# Patient Record
Sex: Female | Born: 1990 | Hispanic: Yes | Marital: Married | Smoking: Never smoker
Health system: Southern US, Community
[De-identification: ages and names within clinical notes are randomized; demographics above are authoritative.]

## PROBLEM LIST (undated history)

## (undated) DIAGNOSIS — Z789 Other specified health status: Secondary | ICD-10-CM

## (undated) DIAGNOSIS — N83209 Unspecified ovarian cyst, unspecified side: Secondary | ICD-10-CM

## (undated) HISTORY — PX: EYE SURGERY: SHX253

---

## 2017-07-30 ENCOUNTER — Other Ambulatory Visit: Payer: Self-pay | Admitting: Advanced Practice Midwife

## 2017-07-30 DIAGNOSIS — Z369 Encounter for antenatal screening, unspecified: Secondary | ICD-10-CM

## 2017-07-30 DIAGNOSIS — Z3689 Encounter for other specified antenatal screening: Secondary | ICD-10-CM

## 2017-08-16 ENCOUNTER — Other Ambulatory Visit: Payer: Self-pay

## 2017-08-20 ENCOUNTER — Ambulatory Visit
Admission: RE | Admit: 2017-08-20 | Discharge: 2017-08-20 | Disposition: A | Discharge status: Home / Self Care | Payer: Medicaid Other | Source: Ambulatory Visit | Attending: Obstetrics & Gynecology | Admitting: Obstetrics & Gynecology

## 2017-08-20 ENCOUNTER — Ambulatory Visit (HOSPITAL_COMMUNITY)
Admission: RE | Admit: 2017-08-20 | Discharge: 2017-08-20 | Disposition: A | Discharge status: Home / Self Care | Payer: Medicaid Other | Source: Ambulatory Visit

## 2017-08-20 ENCOUNTER — Encounter: Payer: Self-pay | Admitting: *Deleted

## 2017-08-20 DIAGNOSIS — Z3689 Encounter for other specified antenatal screening: Secondary | ICD-10-CM

## 2017-08-20 DIAGNOSIS — Z369 Encounter for antenatal screening, unspecified: Secondary | ICD-10-CM | POA: Diagnosis present

## 2017-08-20 HISTORY — DX: Other specified health status: Z78.9

## 2017-08-20 HISTORY — DX: Unspecified ovarian cyst, unspecified side: N83.209

## 2017-08-20 NOTE — Progress Notes (Addendum)
Referring physician:  [facility] Department Length of Consultation: 45 minutes   Ms. Kelsey Bird  was referred to [facility] [place] for genetic counseling to review prenatal screening and testing options.  This note summarizes the information we discussed with the aid of a Spanish interpreter.    We offered the following routine screening tests for this pregnancy:  First trimester screening, which includes nuchal translucency ultrasound screen and first trimester maternal serum marker screening.  The nuchal translucency has approximately an 80% detection rate for Down syndrome and can be positive for other chromosome abnormalities as well as congenital heart defects.  When combined with a maternal serum marker screening, the detection rate is up to 90% for Down syndrome and up to 97% for trisomy 18.     Maternal serum marker screening, a blood test that measures pregnancy proteins, can provide risk assessments for Down syndrome, trisomy 18, and open neural tube defects (spina bifida, anencephaly). Because it does not directly examine the fetus, it cannot positively diagnose or rule out these problems.  Targeted ultrasound uses high frequency sound waves to create an image of the developing fetus.  An ultrasound is often recommended as a routine means of evaluating the pregnancy.  It is also used to screen for fetal anatomy problems (for example, a heart defect) that might be suggestive of a chromosomal or other abnormality.   Should these screening tests indicate an increased concern, then the following additional testing options would be offered:  The chorionic villus sampling procedure is available for first trimester chromosome analysis.  This involves the withdrawal of a small amount of chorionic villi (tissue from the developing placenta).  Risk of pregnancy loss is estimated to be approximately 1 in 200 to 1 in 100 (0.5 to 1%).  There is approximately a 1% (1  in 100) chance that the CVS chromosome results will be unclear.  Chorionic villi cannot be tested for neural tube defects.     Amniocentesis involves the removal of a small amount of amniotic fluid from the sac surrounding the fetus with the use of a thin needle inserted through the maternal abdomen and uterus.  Ultrasound guidance is used throughout the procedure.  Fetal cells from amniotic fluid are directly evaluated and > 99.5% of chromosome problems and > 98% of open neural tube defects can be detected. This procedure is generally performed after the 15th week of pregnancy.  The main risks to this procedure include complications leading to miscarriage in less than 1 in 200 cases (0.5%).  As another option for information if the pregnancy is suspected to be an an increased chance for certain chromosome conditions, we also reviewed the availability of cell free fetal DNA testing from maternal blood to determine whether or not the baby may have either Down syndrome, trisomy 43, or trisomy 70.  This test utilizes a maternal blood sample and DNA sequencing technology to isolate circulating cell free fetal DNA from maternal plasma.  The fetal DNA can then be analyzed for DNA sequences that are derived from the three most common chromosomes involved in aneuploidy, chromosomes 13, 18, and 21.  If the overall amount of DNA is greater than the expected level for any of these chromosomes, aneuploidy is suspected.  While we do not consider it a replacement for invasive testing and karyotype analysis, a negative result from this testing would be reassuring, though not a guarantee of a normal chromosome complement for the baby.  An abnormal result  is certainly suggestive of an abnormal chromosome complement, though we would still recommend CVS or amniocentesis to confirm any findings from this testing.  Cystic Fibrosis and Spinal Muscular Atrophy (SMA) screening were also discussed with the patient. Both conditions are  recessive, which means that both parents must be carriers in order to have a child with the disease.  Cystic fibrosis (CF) is one of the most common genetic conditions in persons of Caucasian ancestry.  This condition occurs in approximately 1 in 2,500 Caucasian persons and results in thickened secretions in the lungs, digestive, and reproductive systems.  For a baby to be at risk for having CF, both of the parents must be carriers for this condition.  Approximately 1 in 61 Caucasian persons is a carrier for CF.  Current carrier testing looks for the most common mutations in the gene for CF and can detect approximately 90% of carriers in the Caucasian population.  This means that the carrier screening can greatly reduce, but cannot eliminate, the chance for an individual to have a child with CF.  If an individual is found to be a carrier for CF, then carrier testing would be available for the partner. As part of [place]'s newborn screening profile, all babies born in the state of [place] will have a two-tier screening process.  Specimens are first tested to determine the concentration of immunoreactive trypsinogen (IRT).  The top 5% of specimens with the highest IRT values then undergo DNA testing using a panel of over 40 common CF mutations. SMA is a neurodegenerative disorder that leads to atrophy of skeletal muscle and overall weakness.  This condition is also more prevalent in the Caucasian population, with 1 in 40-1 in 60 persons being a carrier and 1 in 6,000-1 in 10,000 children being affected.  There are multiple forms of the disease, with some causing death in infancy to other forms with survival into adulthood.  The genetics of SMA is complex, but carrier screening can detect up to 95% of carriers in the Caucasian population.  Similar to CF, a negative result can greatly reduce, but cannot eliminate, the chance to have a child with SMA.  We obtained a detailed family history and pregnancy  history.  The father of the baby, Daphine Deutscher, reported that his maternal grandmother lost several babies (perhaps 2) because they couldn't get needed medicine.  He was not sure if these were babies born too early or infants with illnesses.  He was not aware of any birth differences in these children or any reason for their death other than not being able to get medications.  This grandmother had 5 children to survive to adulthood with no health concerns.  Without additional medical information, it is difficult to determine the risks to other family members related to this history.  If more is learned, we are happy to discuss it further. Ms. Kelsey Bird reported several aunts and uncles on her mother's side who passed away from cancer.  These relatives were in their 60s and had various types of cancer.  Of note, her mother is one of 23 siblings.  We reviewed that cancer most often occurs by chance or due to environmental factors. However, we get concerned about possible inherited factors if several relatives have the same type of cancer, particularly at young ages.  If anyone in the family is concerned about this history in the future, we are happy to provide contact information for a cancer genetic counselor.  Lastly, the patient reported  two maternal great uncles with congenital hearing loss.  They were otherwise said to be in good health with normal development.  There may be many causes for hearing loss, including some which are inherited and others that are not.  Again, we would need additional medical information to determine the recurrence chance for other family members to have a similar condition. The remainder of the family history was reported to be unremarkable for birth defects, intellectual delays, recurrent pregnancy loss or known chromosome abnormalities.  Ms. Kelsey Bird stated that this is her first pregnancy.  She reported no complications or exposures that would be expected to increase the risk for birth  defects.  After consideration of the options, Ms. Kelsey Bird elected to proceed with an ultrasound only. She declined first trimester screening as well as CF and SMA carrier testing.  An appointment was scheduled for a second trimester anatomy ultrasound on 10/01/2017, as her [employer] ends on 10/03/2017.  An ultrasound was performed at the time of the visit.  The gestational age was consistent with 11 weeks.  Fetal anatomy could not be assessed due to early gestational age.  Please refer to the ultrasound report for details of that study.  Ms. Kelsey Bird was encouraged to call with questions or concerns.  We can be contacted at [phone].    Cherly Anderson, MS, CGC  Adalynne Steffensmeier, [place] A, MD

## 2017-09-27 ENCOUNTER — Other Ambulatory Visit: Payer: Self-pay | Admitting: *Deleted

## 2017-09-27 DIAGNOSIS — O99332 Smoking (tobacco) complicating pregnancy, second trimester: Secondary | ICD-10-CM

## 2017-10-01 ENCOUNTER — Ambulatory Visit
Admission: RE | Admit: 2017-10-01 | Discharge: 2017-10-01 | Disposition: A | Discharge status: Home / Self Care | Payer: Self-pay | Source: Ambulatory Visit | Attending: Obstetrics and Gynecology | Admitting: Obstetrics and Gynecology

## 2017-10-01 DIAGNOSIS — O99332 Smoking (tobacco) complicating pregnancy, second trimester: Secondary | ICD-10-CM | POA: Insufficient documentation

## 2017-10-01 DIAGNOSIS — Z3A17 17 weeks gestation of pregnancy: Secondary | ICD-10-CM | POA: Insufficient documentation

## 2017-10-08 ENCOUNTER — Other Ambulatory Visit: Payer: Self-pay

## 2018-06-03 ENCOUNTER — Encounter (HOSPITAL_COMMUNITY): Payer: Self-pay

## 2019-05-13 IMAGING — US US MFM OB COMPLETE +14 WKS
1 series · 14 of 28 positions shown · non-contrast
Comparison: none

PATIENT INFO:

KEE
PERFORMED BY:
SERVICE(S) PROVIDED:
INDICATIONS:
11 weeks gestation of pregnancy
Dating and viability
FETAL EVALUATION:
Num Of Fetuses:     1
Gest. Sac:          Normal
Fetal Pole:         Visualized
Fetal Heart         160
Rate(bpm):
Cardiac Activity:   Present
Presentation:       N/A
Placenta:           N/A
Amniotic Fluid
AFI FV:      Within normal limits
BIOMETRY:
CRL:      40.3  mm     G. Age:  11w 0d                  EDD:   03/11/18
GESTATIONAL AGE:
Best:          11w 0d     Det. By:  U/S C R L (08/20/17)     EDD:   03/11/18

[Series 1: us mfm ob complete +14 wks · 0.25mm/px · 14 of 33 slices shown]
[im 2/33]
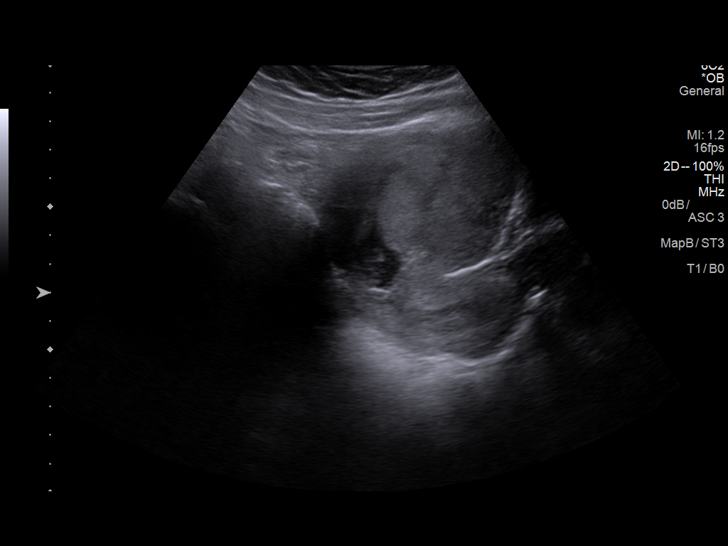
[im 4/33]
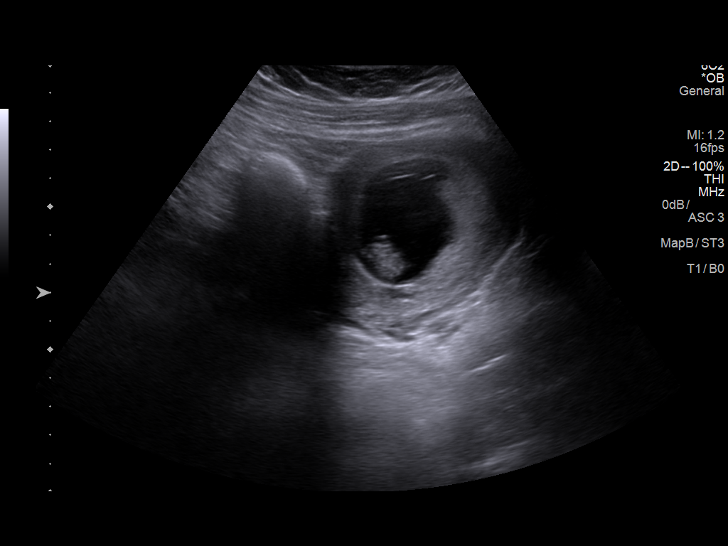
[im 6/33]
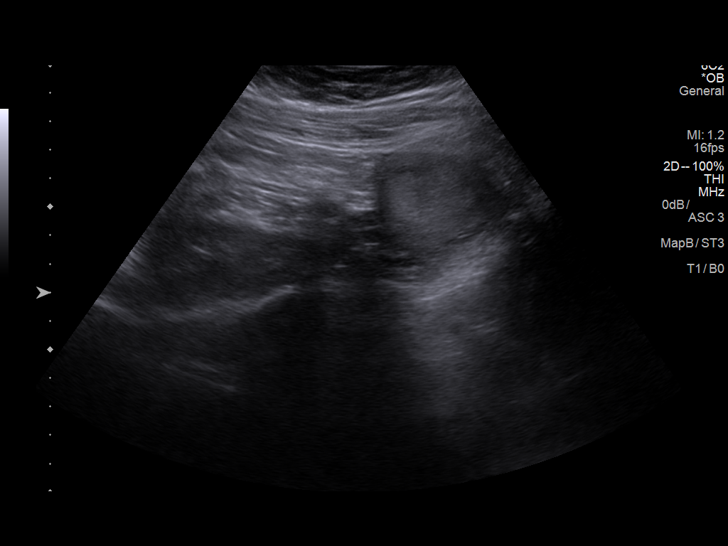
[im 9/33]
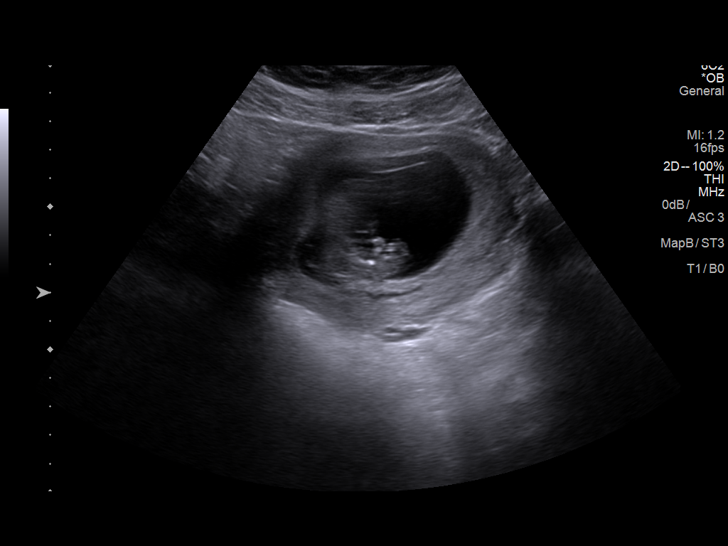
[im 11/33]
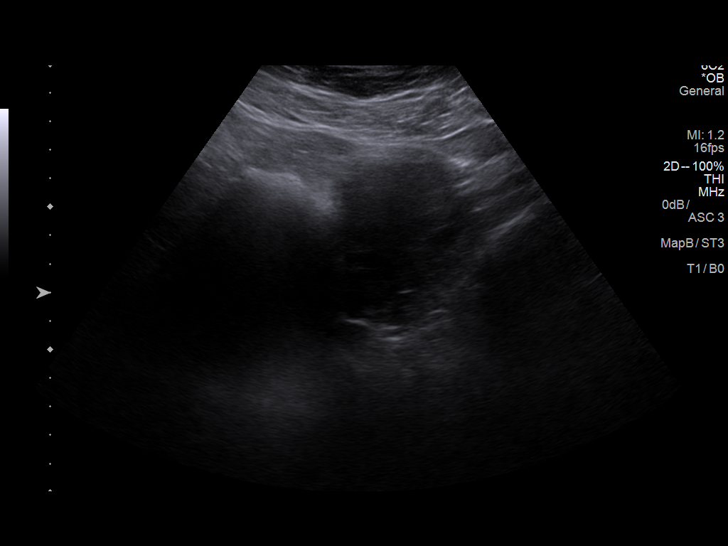
[im 14/33]
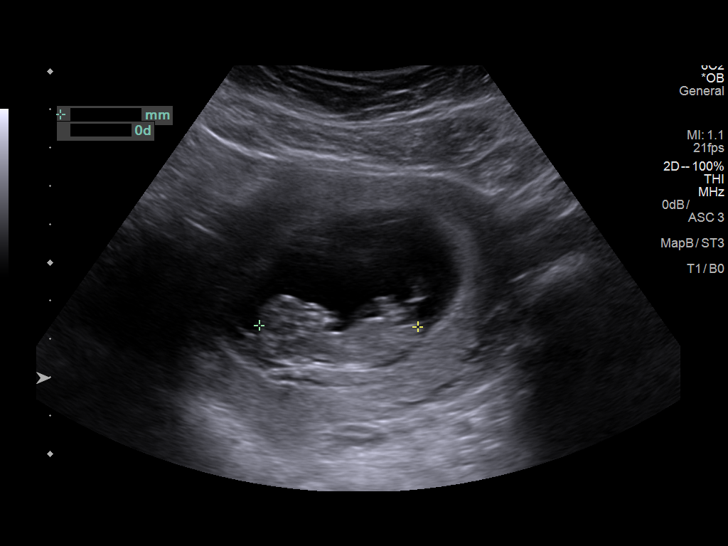
[im 16/33]
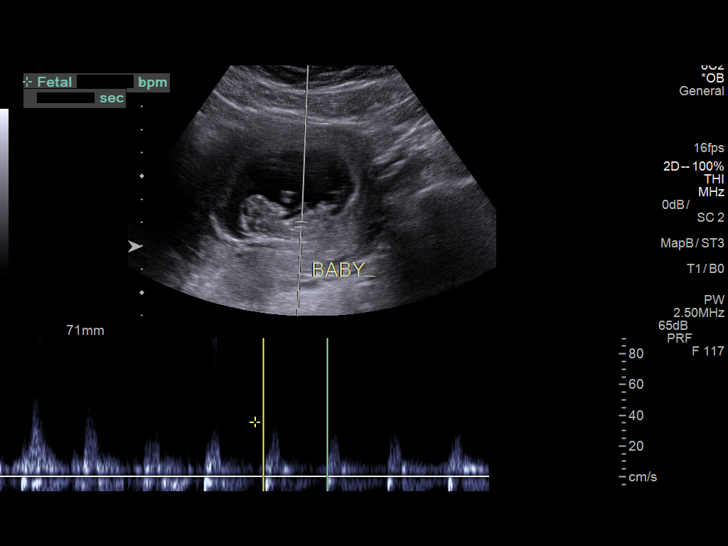
[im 18/33]
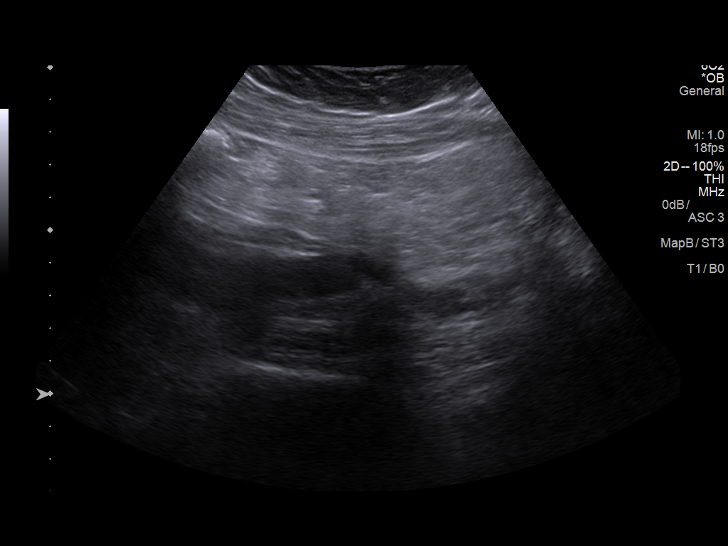
[im 21/33]
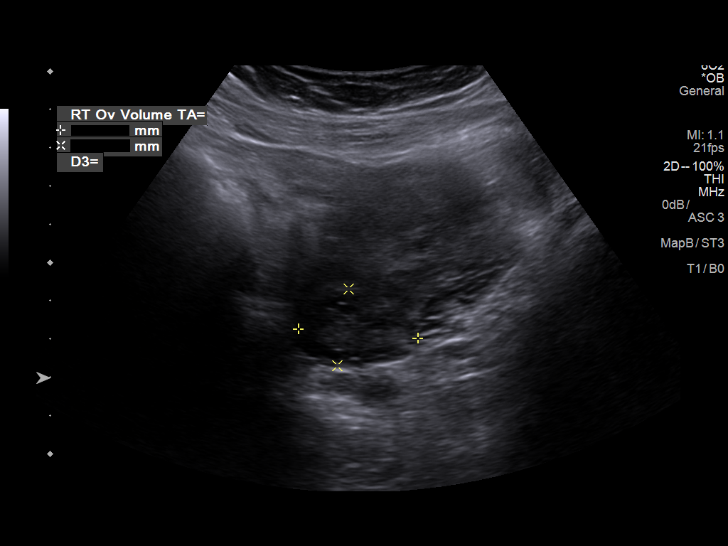
[im 23/33]
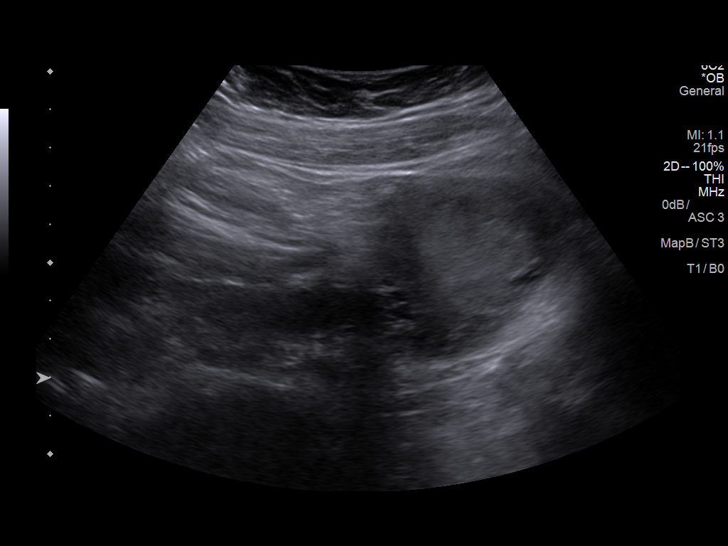
[im 25/33]
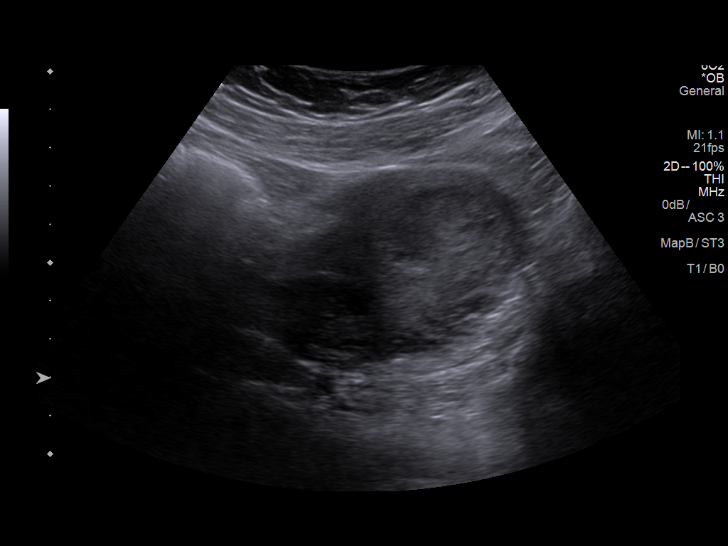
[im 28/33]
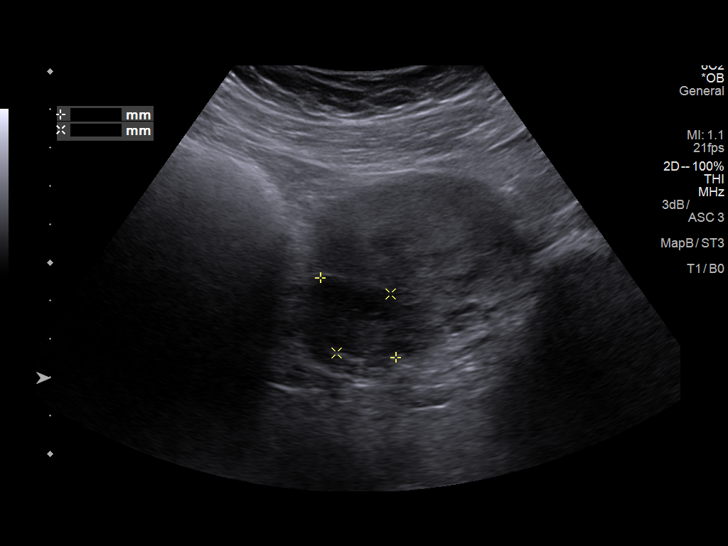
[im 30/33]
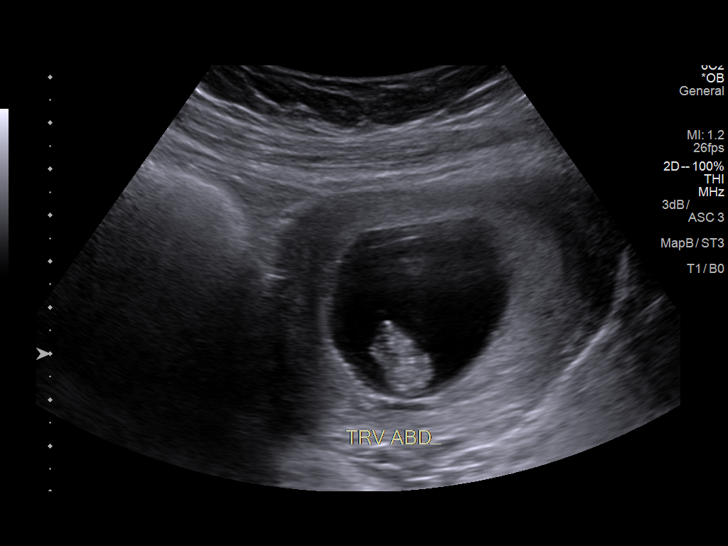
[im 33/33]
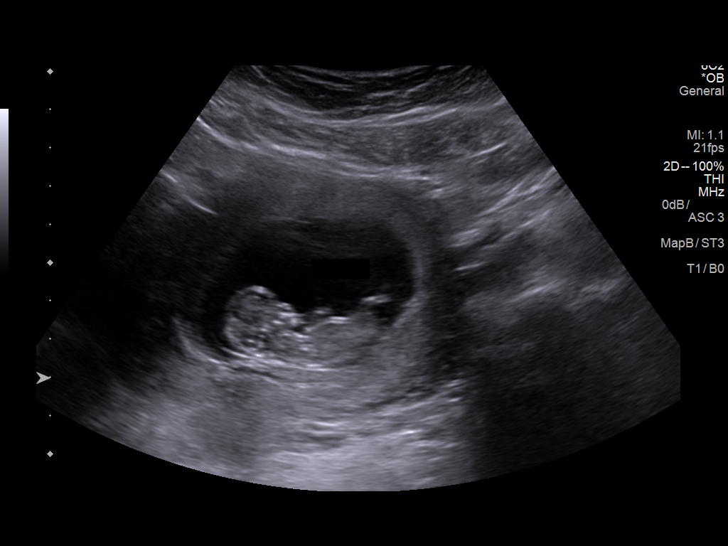

[14 of 28 positions shown; findings below may reference images not displayed]

IMPRESSION: Ultrasound demonstrates a single live pregnancy at 11 0/7
weeks. Dating is by today's ultrasound.

Visualization of fetal anatomy is limited by early gestational
age. The amniotic fluid volume is normal.

Ms. Maricarmen Farber received genetic couseling and declined
aneuploidy screening. She will return for anatomy scan in
approximately 6 weeks.

## 2022-02-27 ENCOUNTER — Other Ambulatory Visit: Payer: Self-pay | Admitting: Pain Medicine

## 2022-02-27 ENCOUNTER — Other Ambulatory Visit (HOSPITAL_COMMUNITY): Payer: Self-pay | Admitting: Family Medicine

## 2022-02-27 ENCOUNTER — Other Ambulatory Visit: Payer: Self-pay | Admitting: Family Medicine

## 2022-02-27 ENCOUNTER — Other Ambulatory Visit (HOSPITAL_COMMUNITY): Payer: Self-pay | Admitting: Pain Medicine

## 2022-02-27 DIAGNOSIS — R1011 Right upper quadrant pain: Secondary | ICD-10-CM

## 2022-03-02 ENCOUNTER — Other Ambulatory Visit: Payer: Self-pay

## 2022-03-02 ENCOUNTER — Ambulatory Visit
Admission: RE | Admit: 2022-03-02 | Discharge: 2022-03-02 | Disposition: A | Discharge status: Home / Self Care | Payer: Self-pay | Source: Ambulatory Visit | Attending: Family Medicine | Admitting: Family Medicine

## 2022-03-02 DIAGNOSIS — R1011 Right upper quadrant pain: Secondary | ICD-10-CM | POA: Insufficient documentation

## 2022-09-07 ENCOUNTER — Other Ambulatory Visit: Payer: Self-pay | Admitting: Family

## 2022-09-07 ENCOUNTER — Other Ambulatory Visit (HOSPITAL_COMMUNITY): Payer: Self-pay | Admitting: Family

## 2022-09-07 DIAGNOSIS — E282 Polycystic ovarian syndrome: Secondary | ICD-10-CM

## 2022-09-14 ENCOUNTER — Ambulatory Visit: Payer: Self-pay | Attending: Family

## 2022-09-26 ENCOUNTER — Ambulatory Visit: Payer: Self-pay | Attending: Family

## 2022-11-01 ENCOUNTER — Emergency Department: Payer: Self-pay

## 2022-11-01 ENCOUNTER — Encounter: Payer: Self-pay | Admitting: *Deleted

## 2022-11-01 ENCOUNTER — Emergency Department
Admission: EM | Admit: 2022-11-01 | Discharge: 2022-11-02 | Disposition: A | Discharge status: Home / Self Care | Payer: Self-pay | Attending: Emergency Medicine | Admitting: Emergency Medicine

## 2022-11-01 ENCOUNTER — Other Ambulatory Visit: Payer: Self-pay

## 2022-11-01 DIAGNOSIS — R102 Pelvic and perineal pain: Secondary | ICD-10-CM

## 2022-11-01 DIAGNOSIS — E282 Polycystic ovarian syndrome: Secondary | ICD-10-CM | POA: Insufficient documentation

## 2022-11-01 LAB — CHLAMYDIA/NGC RT PCR (ARMC ONLY)
Chlamydia Tr: NOT DETECTED
N gonorrhoeae: NOT DETECTED

## 2022-11-01 LAB — URINALYSIS, ROUTINE W REFLEX MICROSCOPIC
Bilirubin Urine: NEGATIVE
Glucose, UA: NEGATIVE mg/dL
Hgb urine dipstick: NEGATIVE
Ketones, ur: NEGATIVE mg/dL
Leukocytes,Ua: NEGATIVE
Nitrite: NEGATIVE
Protein, ur: NEGATIVE mg/dL
Specific Gravity, Urine: 1.005 (ref 1.005–1.030)
pH: 7 (ref 5.0–8.0)

## 2022-11-01 LAB — LIPASE, BLOOD: Lipase: 143 U/L — ABNORMAL HIGH (ref 11–51)

## 2022-11-01 LAB — CBC
HCT: 40.8 % (ref 36.0–46.0)
Hemoglobin: 13.6 g/dL (ref 12.0–15.0)
MCH: 29.7 pg (ref 26.0–34.0)
MCHC: 33.3 g/dL (ref 30.0–36.0)
MCV: 89.1 fL (ref 80.0–100.0)
Platelets: 346 10*3/uL (ref 150–400)
RBC: 4.58 MIL/uL (ref 3.87–5.11)
RDW: 11.9 % (ref 11.5–15.5)
WBC: 6.8 10*3/uL (ref 4.0–10.5)
nRBC: 0 % (ref 0.0–0.2)

## 2022-11-01 LAB — COMPREHENSIVE METABOLIC PANEL
ALT: 19 U/L (ref 0–44)
AST: 22 U/L (ref 15–41)
Albumin: 4.5 g/dL (ref 3.5–5.0)
Alkaline Phosphatase: 50 U/L (ref 38–126)
Anion gap: 8 (ref 5–15)
BUN: 12 mg/dL (ref 6–20)
CO2: 25 mmol/L (ref 22–32)
Calcium: 9.4 mg/dL (ref 8.9–10.3)
Chloride: 105 mmol/L (ref 98–111)
Creatinine, Ser: 0.96 mg/dL (ref 0.44–1.00)
GFR, Estimated: >60 mL/min (ref >60)
Glucose, Bld: 117 mg/dL — ABNORMAL HIGH (ref 70–99)
Potassium: 4 mmol/L (ref 3.5–5.1)
Sodium: 138 mmol/L (ref 135–145)
Total Bilirubin: 0.4 mg/dL (ref 0.3–1.2)
Total Protein: 7.8 g/dL (ref 6.5–8.1)

## 2022-11-01 LAB — WET PREP, GENITAL
Clue Cells Wet Prep HPF POC: NONE SEEN
Sperm: NONE SEEN
Trich, Wet Prep: NONE SEEN
WBC, Wet Prep HPF POC: <10 (ref <10)
Yeast Wet Prep HPF POC: NONE SEEN

## 2022-11-01 LAB — POC URINE PREG, ED: Preg Test, Ur: NEGATIVE

## 2022-11-01 MED ORDER — IOHEXOL 300 MG/ML  SOLN
100.0000 mL | Freq: Once | INTRAMUSCULAR | Status: AC | PRN
  Administered 2022-11-01: 100 mL via INTRAVENOUS

## 2022-11-01 MED ORDER — MELOXICAM 15 MG PO TABS: 15.0000 mg | ORAL_TABLET | Freq: Every day | ORAL | 1 refills | Status: AC

## 2022-11-01 MED ORDER — ACETAMINOPHEN-CAFF-PYRILAMINE 500-60-15 MG PO TABS: 1.0000 | ORAL_TABLET | Freq: Four times a day (QID) | ORAL | 2 refills | Status: AC | PRN

## 2022-11-01 MED ORDER — KETOROLAC TROMETHAMINE 30 MG/ML IJ SOLN
30.0000 mg | Freq: Once | INTRAMUSCULAR | Status: AC
  Administered 2022-11-01: 30 mg via INTRAVENOUS
  Filled 2022-11-01: qty 1

## 2022-11-01 NOTE — ED Notes (Signed)
Poct pregnancy Negative 

## 2022-11-01 NOTE — ED Notes (Signed)
Patient transported to CT 

## 2022-11-01 NOTE — ED Triage Notes (Addendum)
Pt has  right lower abd pain.  No vag bleeding.  Pt  has low back pain.  Pt reports dysuria.  Pt also reports vaginal discharge.  Pt alert interpreter on a stick used in triage.  Hx polycystic disease.  Pt alert  speech clear

## 2022-11-01 NOTE — ED Notes (Signed)
Patient transported to Ultrasound 

## 2022-11-01 NOTE — ED Provider Notes (Signed)
[facility] Provider Note  Patient Contact: 8:14 PM (approximate)   History   Abdominal Pain   HPI  Kelsey Bird is a 31 y.o. female who presents the emergency department complaining of lower abdominal/pelvic pain.  Patient states that she has a history of polycystic ovarian syndrome.  She has been on the Mirena which has controlled symptoms in the past.  Roughly a year ago she stopped the Mirena so that she could have children.  Patient has been largely asymptomatic until approximately a month ago.  She has had some intermittent vaginal discharge initially, now is having ongoing vaginal discharge.  No vaginal bleeding.  Patient is complaining of diffuse lower abdominal/pelvic pain worse on the right than left.  She had her gallbladder surgically removed earlier this year.  Still has her appendix.  Patient states that the pain has been intermittent up into the last 2 days in which case it is sharp again worse on the right than left.  Patient states sometimes it hurts when she urinates though that is not dysuria it is lower pelvic/abdominal pain with urination.  No hematuria.  No history of nephrolithiasis.  Patient is married, has no concerns for STD.     Physical Exam   Triage Vital Signs: ED Triage Vitals  Enc Vitals Group     BP 11/01/22 1725 116/74     Pulse Rate 11/01/22 1725 73     Resp 11/01/22 1725 18     Temp 11/01/22 1731 98.3 F (36.8 C)     Temp Source 11/01/22 1725 Oral     SpO2 11/01/22 1725 99 %     Weight 11/01/22 1726 156 lb 8.4 oz (71 kg)     Height 11/01/22 1726 5\' 7"  (1.702 m)     Head Circumference --      Peak Flow --      Pain Score 11/01/22 1726 9     Pain Loc --      Pain Edu? --      Excl. in GC? --     Most recent vital signs: Vitals:   11/01/22 1725 11/01/22 1731  BP: 116/74   Pulse: 73   Resp: 18   Temp:  98.3 F (36.8 C)  SpO2: 99%      General: Alert and in no acute distress.   Cardiovascular:   Good peripheral perfusion Respiratory: Normal respiratory effort without tachypnea or retractions. Lungs CTAB. Good air entry to the bases with no decreased or absent breath sounds. Gastrointestinal: Bowel sounds 4 quadrants.  Soft to palpation all quadrants.  Patient is tender diffusely in the bilateral lower quadrants in the suprapubic region.  Patient is more tender on the right than left.  No rebound tenderness.. No guarding or rigidity. No palpable masses. No distention. No CVA tenderness. Genitourinary: No external lesions or chancres identified.  Exam under speculum reveals no vaginal lesions or tears.  Patient does have moderate amount of milky discharge in the vaginal vault.  No evidence of vaginal bleeding.  Patient with no palpable lesions in the bilateral adnexa.  Patient has slight cervical motion tenderness Musculoskeletal: Full range of motion to all extremities.  Neurologic:  No gross focal neurologic deficits are appreciated.  Skin:   No rash noted Other:  Genitourinary exam chaperoned by female RN.  ED Results / Procedures / Treatments   Labs (all labs ordered are listed, but only abnormal results are displayed) Labs Reviewed  LIPASE, BLOOD - Abnormal; Notable  for the following components:      Result Value   Lipase 143 (*)    All other components within normal limits  COMPREHENSIVE METABOLIC PANEL - Abnormal; Notable for the following components:   Glucose, Bld 117 (*)    All other components within normal limits  URINALYSIS, ROUTINE W REFLEX MICROSCOPIC - Abnormal; Notable for the following components:   Color, Urine STRAW (*)    APPearance CLEAR (*)    All other components within normal limits  WET PREP, GENITAL  CHLAMYDIA/NGC RT PCR ([facility] ONLY)            CBC  POC URINE PREG, ED     EKG     RADIOLOGY  I personally viewed, evaluated, and interpreted these images as part of my medical decision making, as well as reviewing the written report by the  radiologist.  ED Provider Interpretation: CT scan without acute abnormality identified.  Pelvic ultrasound reveals PCOS but no other acute findings.  US PELVIC COMPLETE W TRANSVAGINAL AND TORSION R/O  Result Date: 11/01/2022 CLINICAL DATA:  [id number] Pelvic pain [id number] EXAM: TRANSABDOMINAL ULTRASOUND OF PELVIS DOPPLER ULTRASOUND OF OVARIES TECHNIQUE: Transabdominal ultrasound examination of the pelvis was performed including evaluation of the uterus, ovaries, adnexal regions, and pelvic cul-de-sac. Color and duplex Doppler ultrasound was utilized to evaluate blood flow to the ovaries. COMPARISON:  CT abdomen pelvis 11/01/2022 FINDINGS: Uterus Measurements: 7 x 3.7 x 4.8 cm = volume: 64 mL. No fibroids or other mass visualized. Likely C-section scar. Nabothian cysts. Endometrium Thickness: 6 mm.  No focal abnormality visualized. Right ovary Measurements: 4.3 x 2.3 x 2.7 cm 4 = volume: 14 mL. Multiple peripheral subcentimeter follicles. No adnexal mass. Left ovary Measurements: 5.2 x 2.4 x 3.3 cm = volume: 22 mL. Multiple peripheral subcentimeter follicles. No adnexal mass. Pulsed Doppler evaluation demonstrates normal low-resistance arterial and venous waveforms in both ovaries. Other: No intraperitoneal free fluid. No intraperitoneal free gas. No organized fluid collection. IMPRESSION: 1. Morphology of ovaries suggestive of PCOS. Recommend correlation with clinical signs and symptoms. 2. Otherwise unremarkable pelvic ultrasound. Electronically Signed   By: Tish Frederickson M.D.   On: 11/01/2022 23:15   CT ABDOMEN PELVIS W CONTRAST  Result Date: 11/01/2022 CLINICAL DATA:  Lower abdominal pain, right greater than left. Dysuria. Vaginal discharge. EXAM: CT ABDOMEN AND PELVIS WITH CONTRAST TECHNIQUE: Multidetector CT imaging of the abdomen and pelvis was performed using the standard protocol following bolus administration of intravenous contrast. RADIATION DOSE REDUCTION: This exam was performed according to the  departmental dose-optimization program which includes automated exposure control, adjustment of the mA and/or kV according to patient size and/or use of iterative reconstruction technique. CONTRAST:  OMNIPAQUE IOHEXOL 300 MG/ML  SOLN COMPARISON:  Hepatobiliary ultrasound of 03/02/2022 FINDINGS: Lower chest: Unremarkable Hepatobiliary: Cholecystectomy.  Otherwise unremarkable Pancreas: Unremarkable Spleen: Unremarkable Adrenals/Urinary Tract: Unremarkable. The urinary bladder appears normal. Stomach/Bowel: Unremarkable.  Normal appendix. Vascular/Lymphatic: Unremarkable Reproductive: Small lucency along the anterior lower uterine segment on image 63 series 6, likely a manifestation of prior cesarean section. No significant adnexal/ovarian abnormality. Other: No supplemental non-categorized findings. Musculoskeletal: Unremarkable IMPRESSION: 1. A specific cause for the patient's pelvic pain is not identified. 2. Small lucency along the anterior lower uterine segment is likely a residuum from prior cesarean section. Electronically Signed   By: Gaylyn Rong M.D.   On: 11/01/2022 21:34    PROCEDURES:  Critical Care performed: No  Procedures   MEDICATIONS ORDERED IN ED: Medications  ketorolac (TORADOL) 30  MG/ML injection 30 mg (has no administration in time range)  iohexol (OMNIPAQUE) 300 MG/ML solution 100 mL (100 mLs Intravenous Contrast Given 11/01/22 2119)     IMPRESSION / MDM / ASSESSMENT AND PLAN / ED COURSE  I reviewed the triage vital signs and the nursing notes.                              Differential diagnosis includes, but is not limited to, pregnancy, ectopic pregnancy, PCOS, ovarian cyst, appendicitis, colitis, constipation, UTI, nephrolithiasis, pyelonephritis  Patient's presentation is most consistent with acute presentation with potential threat to life or bodily function.   Patient's diagnosis is consistent with pelvic pain, PCOS.  Patient presents emergency  department lower pelvic pain.  Patient has a history of PCOS, states that the pain is worse than typical.  Patient was tender in the suprapubic region and bilateral lower quadrants slightly worse on right than left.  Patient had labs which are reassuring.  CT scan revealed no acute findings, specifically no evidence of appendicitis, nephrolithiasis, colitis.  Patient had ultrasound which revealed PCOS but no other acute findings.  Again as this is consistent just worse than patient's typical PCOS will treat symptomatically.  Concerning signs and symptoms and return precautions discussed with the patient.  Follow-up with primary care or OB/GYN as needed..  Patient prescribed Midol and meloxicam for symptom relief.  Patient is given ED precautions to return to the ED for any worsening or new symptoms.        FINAL CLINICAL IMPRESSION(S) / ED DIAGNOSES   Final diagnoses:  Pelvic pain  PCOS (polycystic ovarian syndrome)     Rx / DC Orders   ED Discharge Orders          Ordered    Acetaminophen-Caff-Pyrilamine 500-60-15 MG TABS  Every 6 hours PRN        11/01/22 2333    meloxicam (MOBIC) 15 MG tablet  Daily        11/01/22 2333             Note:  This document was prepared using Dragon voice recognition software and may include unintentional dictation errors.   Lanette Hampshire 11/01/22 2339    Minna Antis, MD 11/02/22 2320

## 2024-02-13 IMAGING — US US ABDOMEN LIMITED
1 series · 15 of 25 positions shown · non-contrast
Comparison: None.

CLINICAL DATA: Right upper quadrant abdominal pain

EXAM:
ULTRASOUND ABDOMEN LIMITED RIGHT UPPER QUADRANT

[Series 1: us abdomen limited ruq · 15 of 74 slices shown]
[im 1/74]
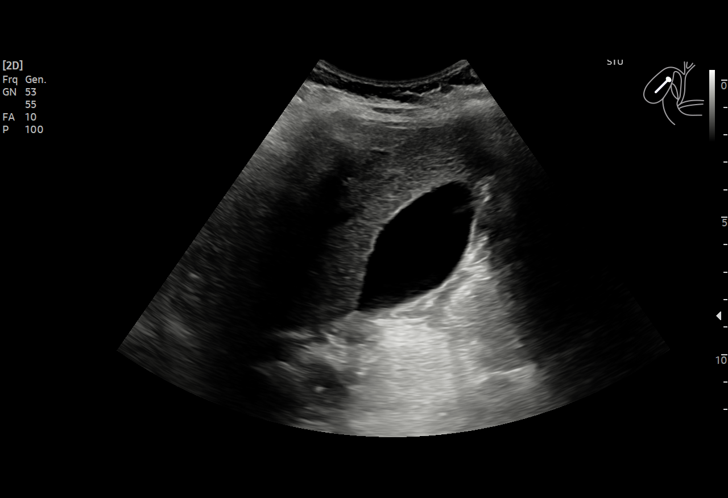
[im 7/74]
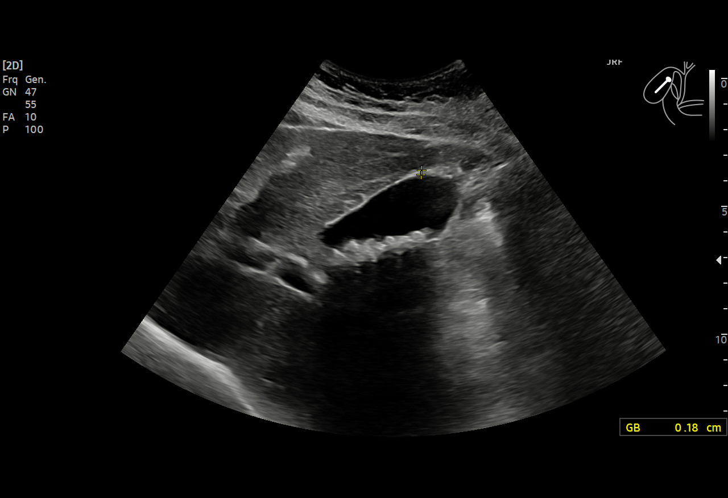
[im 13/74]
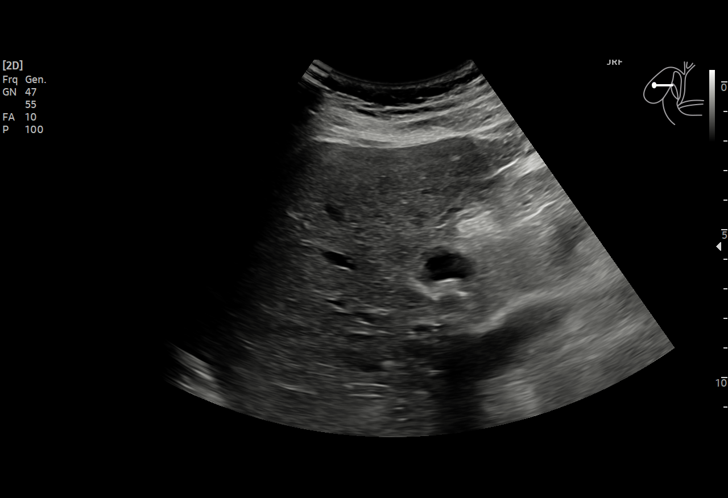
[im 16/74]
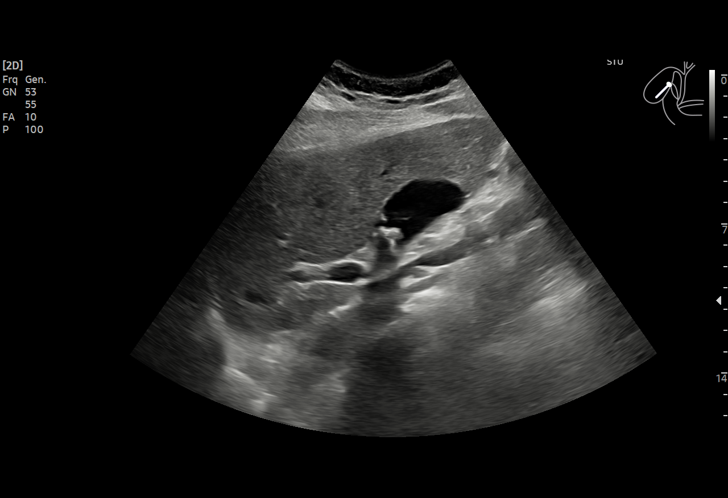
[im 22/74]
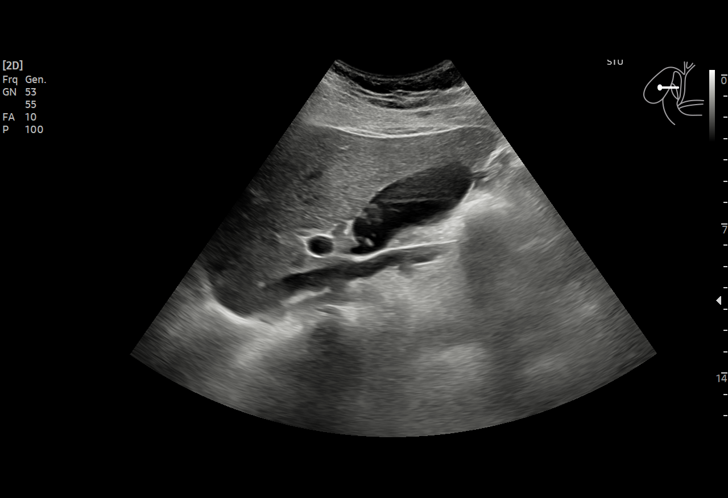
[im 28/74]
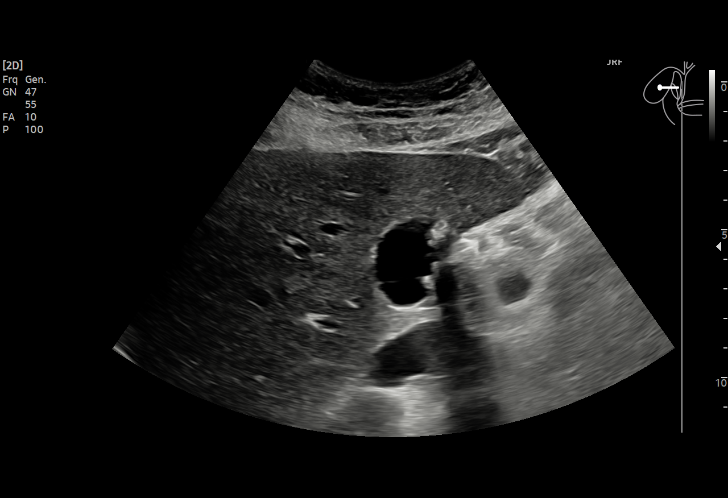
[im 31/74]
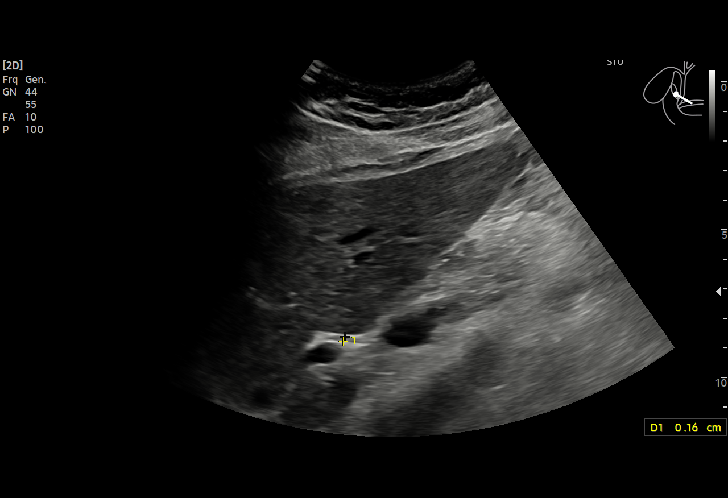
[im 37/74]
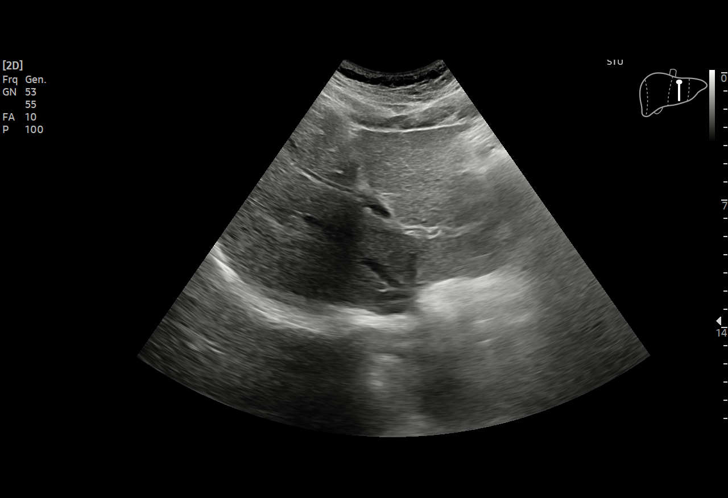
[im 43/74]
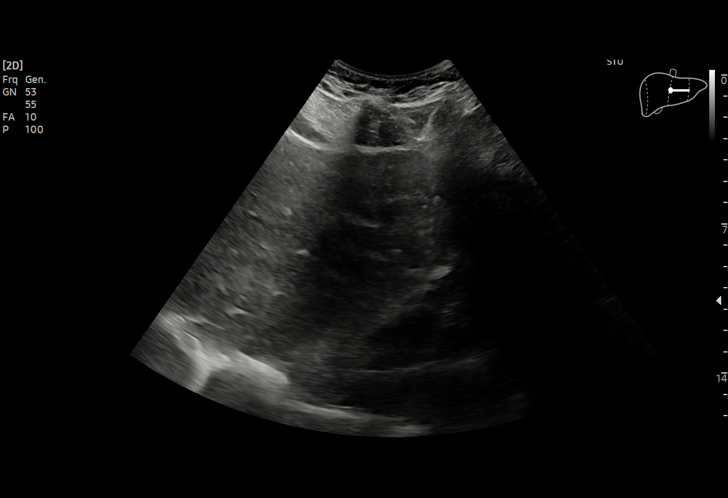
[im 46/74]
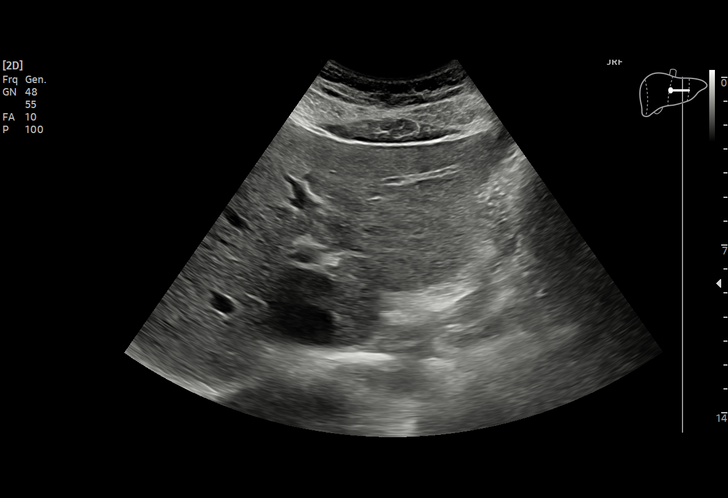
[im 52/74]
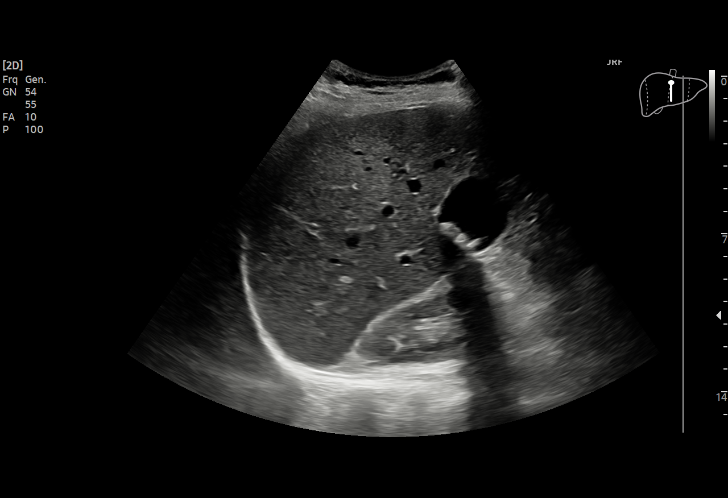
[im 58/74]
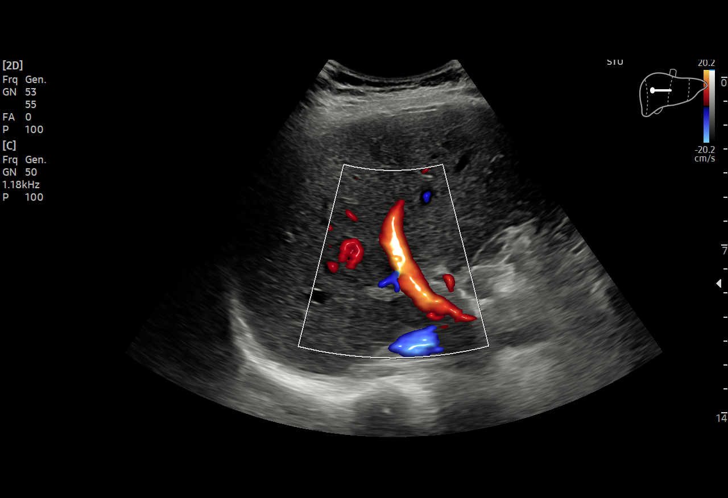
[im 61/74]
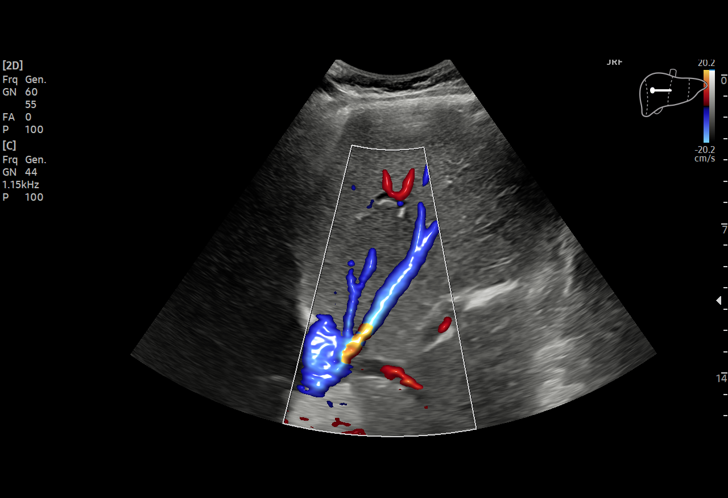
[im 67/74]
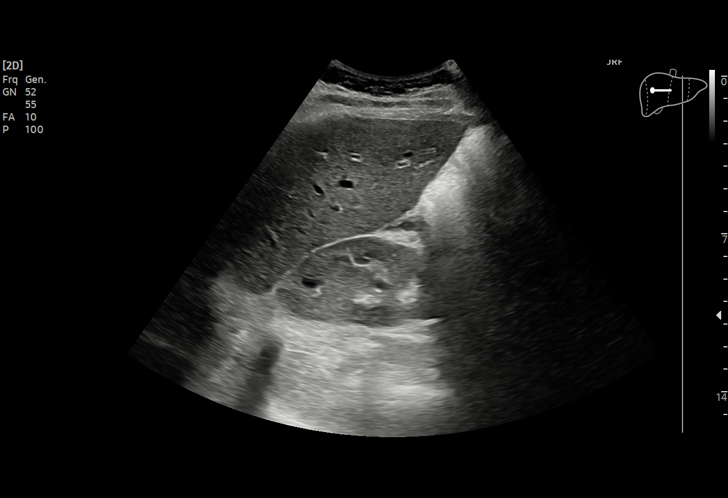
[im 74/74]
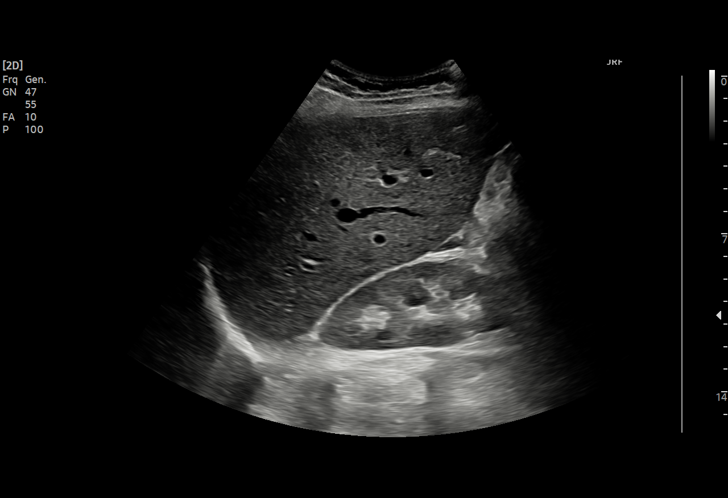

[15 of 25 positions shown; findings below may reference images not displayed]

FINDINGS: Gallbladder:

Numerous shadowing gallstones, the largest of which measures up to
0.8 cm. No gallbladder wall thickening or pericholecystic fluid. No
sonographic Murphy sign noted by sonographer.

Common bile duct:

Diameter: 4 mm

Liver:

No focal lesion identified. Within normal limits in parenchymal
echogenicity. Portal vein is patent on color Doppler imaging with
normal direction of blood flow towards the liver.

Other: None.
IMPRESSION: 1. Cholelithiasis without evidence of cholecystitis.
2. No other acute finding to explain the patient's right upper
quadrant pain.
